# Patient Record
Sex: Male | Born: 1945 | Race: Black or African American | Hispanic: No | Smoking: Current every day smoker
Health system: Southern US, Community
[De-identification: ages and names within clinical notes are randomized; demographics above are authoritative.]

## PROBLEM LIST (undated history)

## (undated) DIAGNOSIS — Z789 Other specified health status: Secondary | ICD-10-CM

## (undated) HISTORY — PX: OTHER SURGICAL HISTORY: SHX169

---

## 2015-01-07 ENCOUNTER — Emergency Department (HOSPITAL_COMMUNITY): Payer: Medicare Other

## 2015-01-07 ENCOUNTER — Encounter (HOSPITAL_COMMUNITY): Payer: Self-pay | Admitting: Internal Medicine

## 2015-01-07 ENCOUNTER — Observation Stay (HOSPITAL_COMMUNITY)
Admission: EM | Admit: 2015-01-07 | Discharge: 2015-01-08 | Discharge status: Against Medical Advice | Payer: Medicare Other | Attending: Internal Medicine | Admitting: Internal Medicine

## 2015-01-07 DIAGNOSIS — R7989 Other specified abnormal findings of blood chemistry: Secondary | ICD-10-CM | POA: Diagnosis present

## 2015-01-07 DIAGNOSIS — G9340 Encephalopathy, unspecified: Secondary | ICD-10-CM | POA: Diagnosis not present

## 2015-01-07 DIAGNOSIS — E872 Acidosis, unspecified: Secondary | ICD-10-CM | POA: Diagnosis present

## 2015-01-07 DIAGNOSIS — R778 Other specified abnormalities of plasma proteins: Secondary | ICD-10-CM | POA: Diagnosis present

## 2015-01-07 DIAGNOSIS — E860 Dehydration: Secondary | ICD-10-CM | POA: Insufficient documentation

## 2015-01-07 DIAGNOSIS — Z87828 Personal history of other (healed) physical injury and trauma: Secondary | ICD-10-CM | POA: Diagnosis not present

## 2015-01-07 DIAGNOSIS — F17200 Nicotine dependence, unspecified, uncomplicated: Secondary | ICD-10-CM | POA: Insufficient documentation

## 2015-01-07 DIAGNOSIS — F1210 Cannabis abuse, uncomplicated: Secondary | ICD-10-CM | POA: Insufficient documentation

## 2015-01-07 DIAGNOSIS — N179 Acute kidney failure, unspecified: Principal | ICD-10-CM | POA: Insufficient documentation

## 2015-01-07 DIAGNOSIS — R4182 Altered mental status, unspecified: Secondary | ICD-10-CM | POA: Insufficient documentation

## 2015-01-07 DIAGNOSIS — R748 Abnormal levels of other serum enzymes: Secondary | ICD-10-CM | POA: Insufficient documentation

## 2015-01-07 DIAGNOSIS — R9431 Abnormal electrocardiogram [ECG] [EKG]: Secondary | ICD-10-CM

## 2015-01-07 HISTORY — DX: Other specified health status: Z78.9

## 2015-01-07 LAB — CBC WITH DIFFERENTIAL/PLATELET
Basophils Absolute: 0 10*3/uL (ref 0.0–0.1)
Basophils Relative: 0 % (ref 0–1)
EOS ABS: 0 10*3/uL (ref 0.0–0.7)
EOS PCT: 0 % (ref 0–5)
HEMATOCRIT: 47.9 % (ref 39.0–52.0)
HEMOGLOBIN: 16.1 g/dL (ref 13.0–17.0)
LYMPHS ABS: 1.9 10*3/uL (ref 0.7–4.0)
Lymphocytes Relative: 16 % (ref 12–46)
MCH: 32.4 pg (ref 26.0–34.0)
MCHC: 33.6 g/dL (ref 30.0–36.0)
MCV: 96.4 fL (ref 78.0–100.0)
MONO ABS: 1.6 10*3/uL — AB (ref 0.1–1.0)
Monocytes Relative: 13 % — ABNORMAL HIGH (ref 3–12)
Neutro Abs: 8.3 10*3/uL — ABNORMAL HIGH (ref 1.7–7.7)
Neutrophils Relative %: 70 % (ref 43–77)
Platelets: 200 10*3/uL (ref 150–400)
RBC: 4.97 MIL/uL (ref 4.22–5.81)
RDW: 15 % (ref 11.5–15.5)
WBC: 11.8 10*3/uL — ABNORMAL HIGH (ref 4.0–10.5)

## 2015-01-07 LAB — RAPID URINE DRUG SCREEN, HOSP PERFORMED
Amphetamines: NOT DETECTED
BARBITURATES: NOT DETECTED
Benzodiazepines: NOT DETECTED
Cocaine: NOT DETECTED
Opiates: NOT DETECTED
Tetrahydrocannabinol: POSITIVE — AB

## 2015-01-07 LAB — COMPREHENSIVE METABOLIC PANEL
ALK PHOS: 60 U/L (ref 38–126)
ALT: 20 U/L (ref 17–63)
AST: 28 U/L (ref 15–41)
Albumin: 3.9 g/dL (ref 3.5–5.0)
Anion gap: 9 (ref 5–15)
BILIRUBIN TOTAL: 0.5 mg/dL (ref 0.3–1.2)
BUN: 17 mg/dL (ref 6–20)
CHLORIDE: 112 mmol/L — AB (ref 101–111)
CO2: 20 mmol/L — ABNORMAL LOW (ref 22–32)
Calcium: 9.3 mg/dL (ref 8.9–10.3)
Creatinine, Ser: 1.86 mg/dL — ABNORMAL HIGH (ref 0.61–1.24)
GFR, EST AFRICAN AMERICAN: 42 mL/min — AB (ref >60)
GFR, EST NON AFRICAN AMERICAN: 36 mL/min — AB (ref >60)
GLUCOSE: 106 mg/dL — AB (ref 65–99)
Potassium: 4.6 mmol/L (ref 3.5–5.1)
Sodium: 141 mmol/L (ref 135–145)
Total Protein: 7.2 g/dL (ref 6.5–8.1)

## 2015-01-07 LAB — URINE MICROSCOPIC-ADD ON

## 2015-01-07 LAB — URINALYSIS, ROUTINE W REFLEX MICROSCOPIC
Bilirubin Urine: NEGATIVE
Glucose, UA: NEGATIVE mg/dL
KETONES UR: 15 mg/dL — AB
LEUKOCYTES UA: NEGATIVE
NITRITE: NEGATIVE
Protein, ur: 30 mg/dL — AB
Specific Gravity, Urine: 1.023 (ref 1.005–1.030)
UROBILINOGEN UA: 0.2 mg/dL (ref 0.0–1.0)
pH: 5.5 (ref 5.0–8.0)

## 2015-01-07 LAB — ETHANOL: Alcohol, Ethyl (B): <5 mg/dL (ref <5)

## 2015-01-07 LAB — LIPASE, BLOOD: LIPASE: 29 U/L (ref 22–51)

## 2015-01-07 LAB — I-STAT CG4 LACTIC ACID, ED: Lactic Acid, Venous: 2.89 mmol/L (ref 0.5–2.0)

## 2015-01-07 LAB — CBG MONITORING, ED: Glucose-Capillary: 101 mg/dL — ABNORMAL HIGH (ref 65–99)

## 2015-01-07 LAB — PROTIME-INR
INR: 1.15 (ref 0.00–1.49)
Prothrombin Time: 14.9 seconds (ref 11.6–15.2)

## 2015-01-07 LAB — BRAIN NATRIURETIC PEPTIDE: B NATRIURETIC PEPTIDE 5: 15.7 pg/mL (ref 0.0–100.0)

## 2015-01-07 LAB — TROPONIN I: TROPONIN I: 0.08 ng/mL — AB (ref <0.031)

## 2015-01-07 MED ORDER — ASPIRIN 325 MG PO TABS
325.0000 mg | ORAL_TABLET | Freq: Once | ORAL | Status: AC
  Administered 2015-01-07: 325 mg via ORAL
  Filled 2015-01-07: qty 1

## 2015-01-07 MED ORDER — ONDANSETRON HCL 4 MG/2ML IJ SOLN: 4.0000 mg | Freq: Four times a day (QID) | INTRAMUSCULAR | Status: DC | PRN

## 2015-01-07 MED ORDER — ADULT MULTIVITAMIN W/MINERALS CH: 1.0000 | ORAL_TABLET | Freq: Every day | ORAL | Status: DC

## 2015-01-07 MED ORDER — LORAZEPAM 1 MG PO TABS: 1.0000 mg | ORAL_TABLET | Freq: Four times a day (QID) | ORAL | Status: DC | PRN

## 2015-01-07 MED ORDER — VITAMIN B-1 100 MG PO TABS: 100.0000 mg | ORAL_TABLET | Freq: Every day | ORAL | Status: DC

## 2015-01-07 MED ORDER — THIAMINE HCL 100 MG/ML IJ SOLN: 100.0000 mg | Freq: Every day | INTRAMUSCULAR | Status: DC

## 2015-01-07 MED ORDER — FOLIC ACID 1 MG PO TABS: 1.0000 mg | ORAL_TABLET | Freq: Every day | ORAL | Status: DC

## 2015-01-07 MED ORDER — SODIUM CHLORIDE 0.9 % IV BOLUS (SEPSIS)
500.0000 mL | Freq: Once | INTRAVENOUS | Status: AC
Start: 2015-01-07 — End: 2015-01-07
  Administered 2015-01-07: 500 mL via INTRAVENOUS

## 2015-01-07 MED ORDER — ONDANSETRON HCL 4 MG PO TABS: 4.0000 mg | ORAL_TABLET | Freq: Four times a day (QID) | ORAL | Status: DC | PRN

## 2015-01-07 MED ORDER — SODIUM CHLORIDE 0.9 % IV SOLN
INTRAVENOUS | Status: DC
  Administered 2015-01-07: 23:00:00 via INTRAVENOUS

## 2015-01-07 MED ORDER — ACETAMINOPHEN 650 MG RE SUPP
650.0000 mg | Freq: Four times a day (QID) | RECTAL | Status: DC | PRN
Start: 2015-01-07 — End: 2015-01-08

## 2015-01-07 MED ORDER — ENOXAPARIN SODIUM 40 MG/0.4ML ~~LOC~~ SOLN: 40.0000 mg | SUBCUTANEOUS | Status: DC

## 2015-01-07 MED ORDER — ACETAMINOPHEN 325 MG PO TABS
650.0000 mg | ORAL_TABLET | Freq: Four times a day (QID) | ORAL | Status: DC | PRN
Start: 2015-01-07 — End: 2015-01-08

## 2015-01-07 MED ORDER — LORAZEPAM 2 MG/ML IJ SOLN: 1.0000 mg | Freq: Four times a day (QID) | INTRAMUSCULAR | Status: DC | PRN

## 2015-01-07 NOTE — ED Notes (Signed)
Pt transported to x-ray and CT scan. 

## 2015-01-07 NOTE — ED Notes (Signed)
Daughters at the bedside, asking to speak to the physician and possibly have pt transferred back to [facility] where he receives care. One of the daughter states they are not going to be able to stay.

## 2015-01-07 NOTE — ED Notes (Signed)
Dr. Toniann Fail at the bedside to speak with daughters. They would like to sign him out and take him home to check him in to [facility]. Dr. Kirtland Bouchard is ok with this.
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
## 2015-01-07 NOTE — ED Notes (Signed)
CBG 101 

## 2015-01-07 NOTE — ED Notes (Signed)
Pt resting quietly at the time. Vital signs stable. Drinking water. No signs of distress noted.
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
## 2015-01-07 NOTE — ED Notes (Signed)
NS at 125 started per Dr Kirtland Bouchard
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
## 2016-06-10 IMAGING — CR DG CHEST 2V
2 series · 2 of 2 positions shown · non-contrast
Comparison: None.

CLINICAL DATA: Altered mental status.  Occasional smoker.

EXAM:
CHEST  2 VIEW

[chest pa]
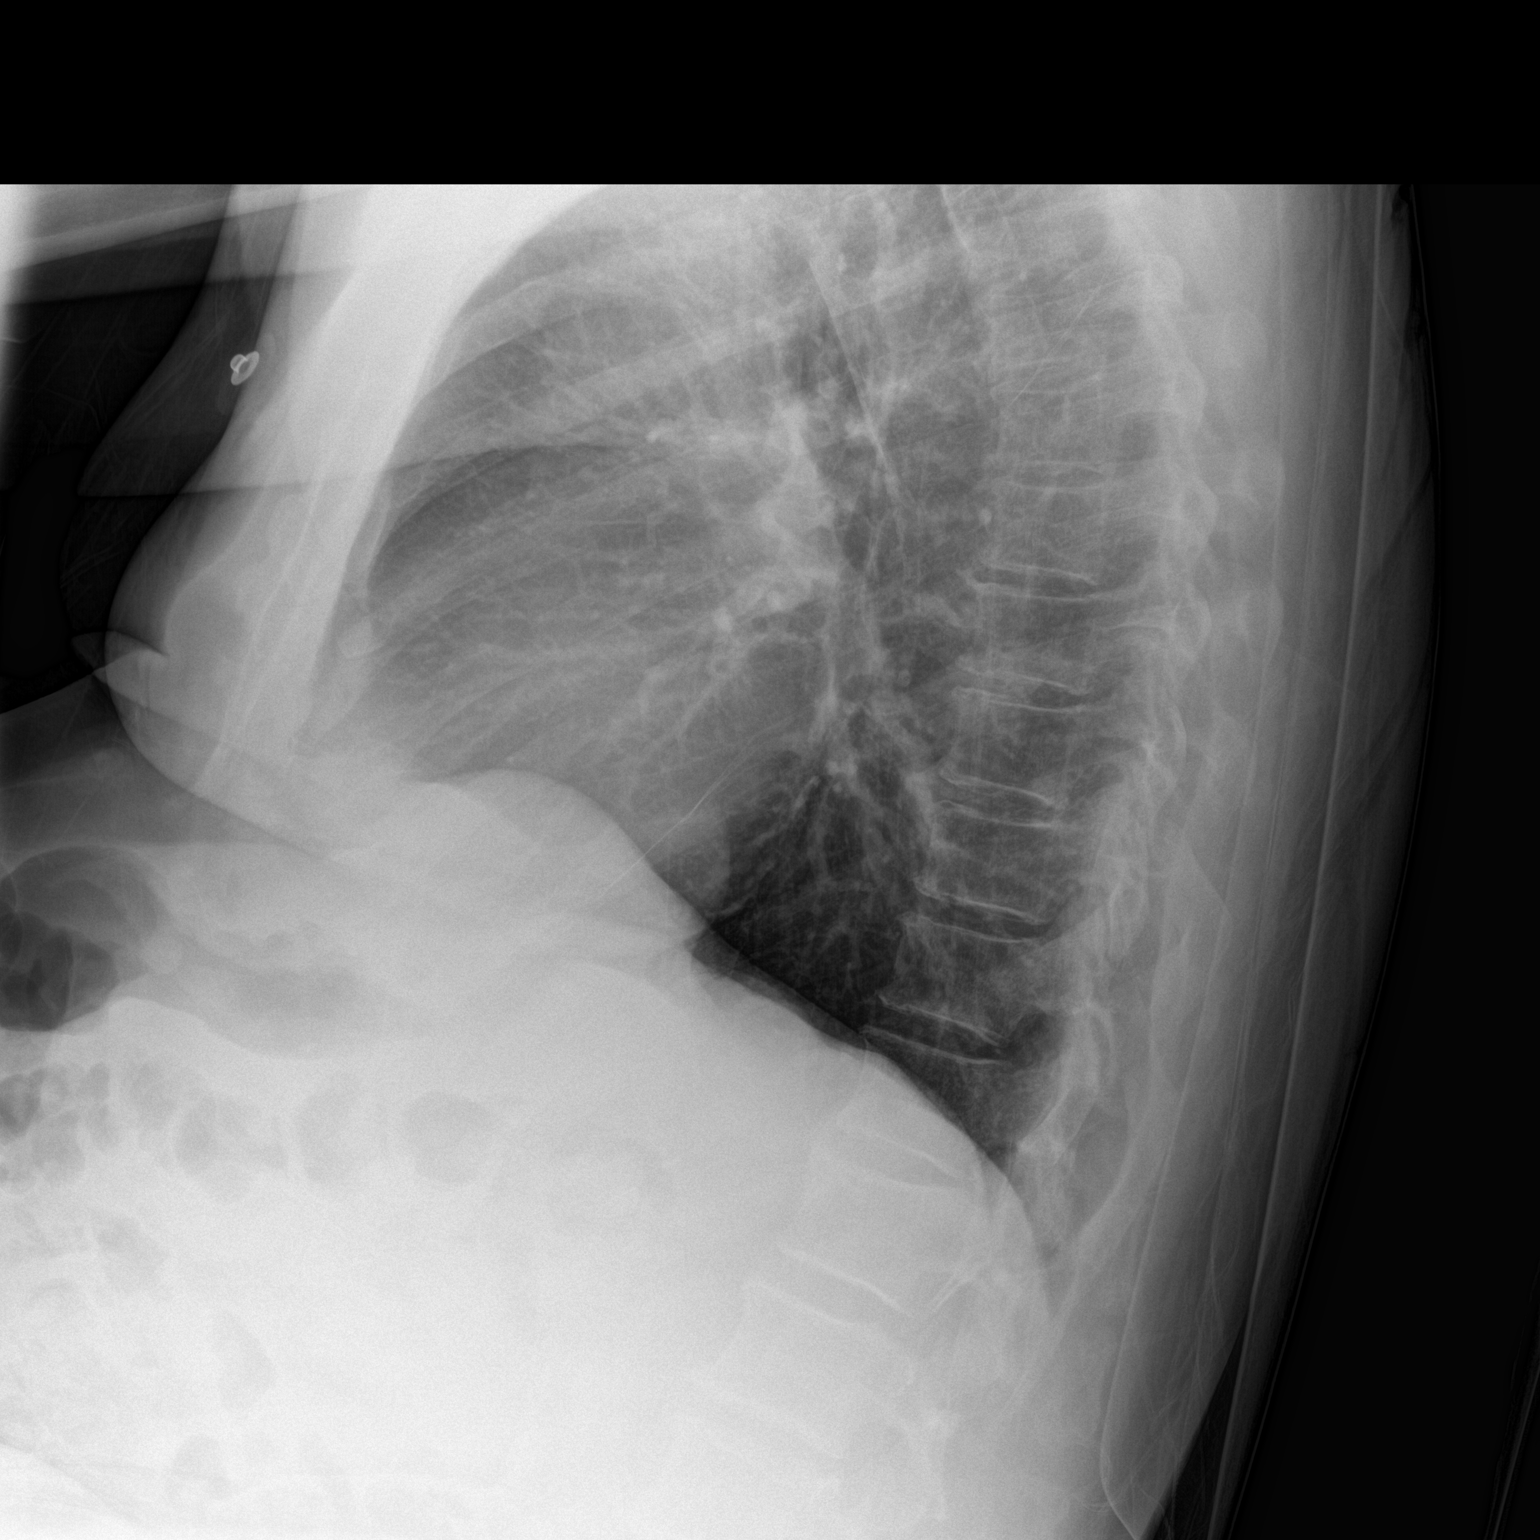

[chest ap]
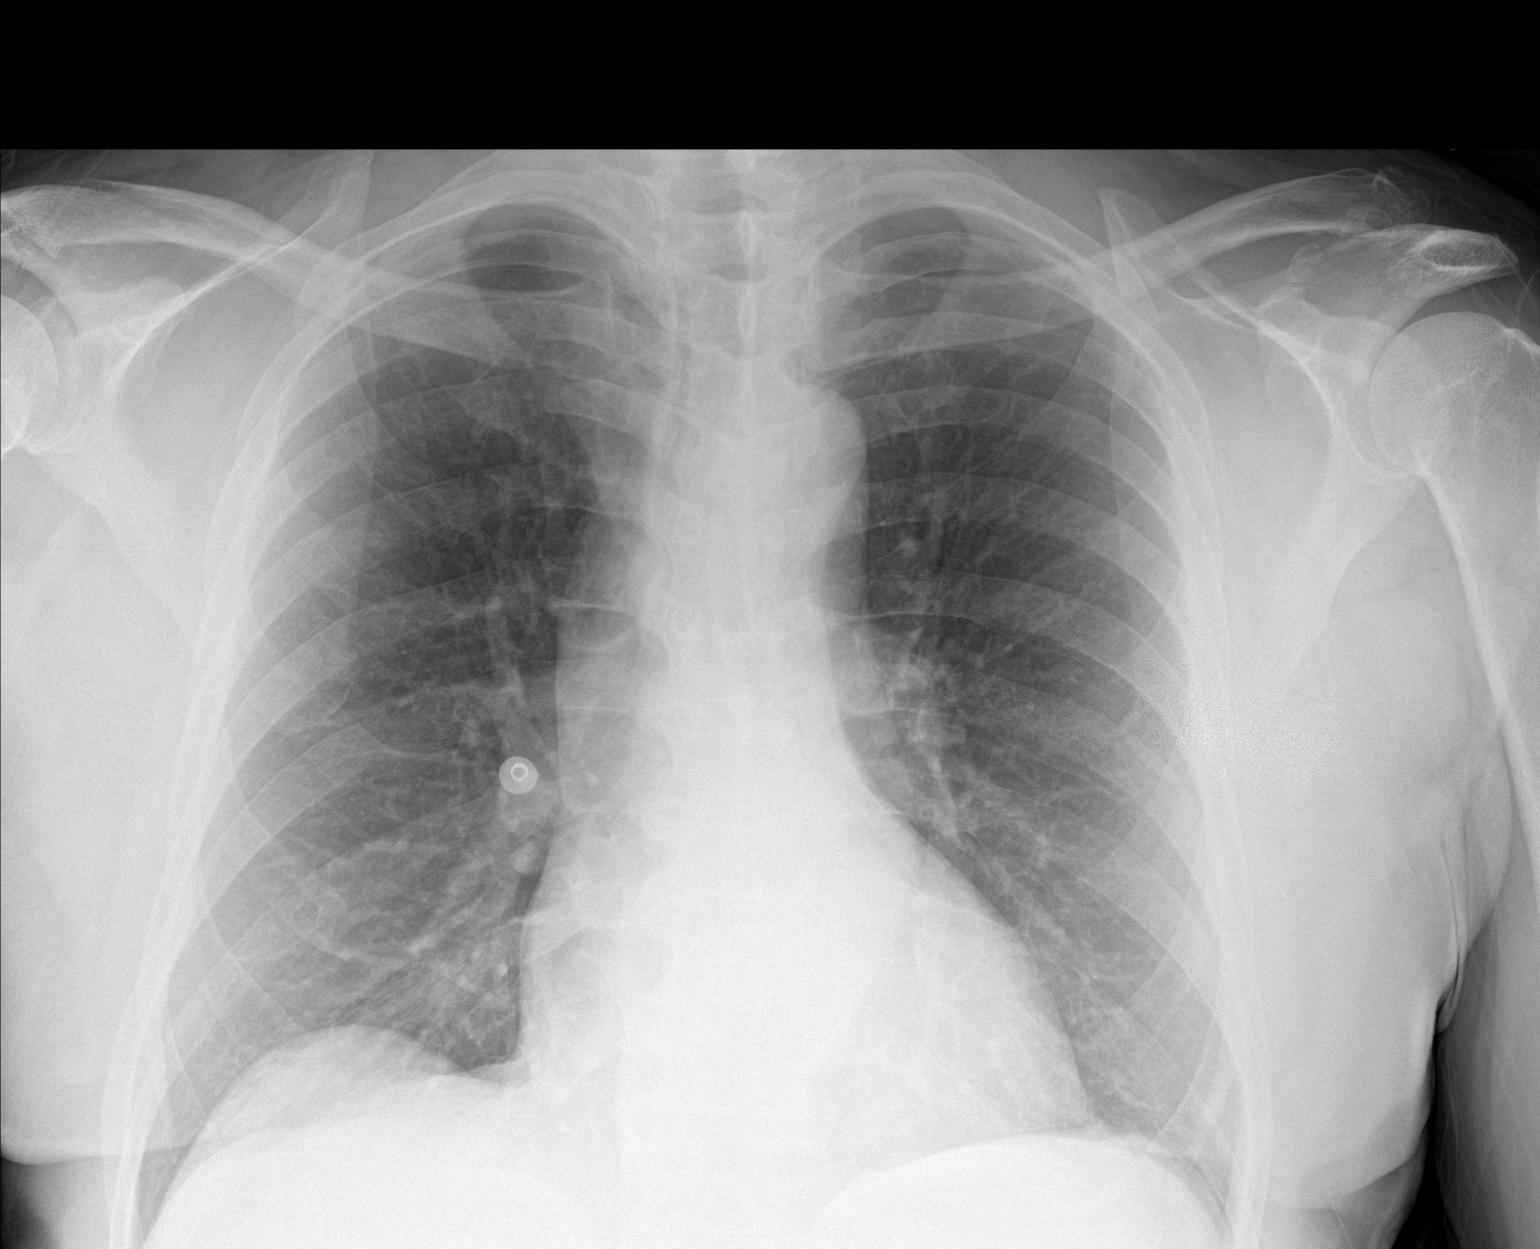

[2 of 2 positions shown; findings below may reference images not displayed]

FINDINGS: The cardiomediastinal silhouette is within normal limits. The lungs
are well inflated. Minimal scarring or atelectasis is noted in the
left lung base. The lungs are otherwise clear. No pleural effusion
or pneumothorax is identified. No acute osseous abnormality is seen.
IMPRESSION: No active cardiopulmonary disease.
